# Patient Record
Sex: Male | Born: 1940 | Race: Black or African American | Hispanic: No | Marital: Married | Smoking: Former smoker
Health system: Southern US, Community
[De-identification: ages and names within clinical notes are randomized; demographics above are authoritative.]

## PROBLEM LIST (undated history)

## (undated) DIAGNOSIS — I10 Essential (primary) hypertension: Secondary | ICD-10-CM

## (undated) DIAGNOSIS — E119 Type 2 diabetes mellitus without complications: Secondary | ICD-10-CM

---

## 2017-05-04 ENCOUNTER — Emergency Department (HOSPITAL_COMMUNITY): Payer: Medicare Other

## 2017-05-04 ENCOUNTER — Emergency Department (HOSPITAL_COMMUNITY)
Admission: EM | Admit: 2017-05-04 | Discharge: 2017-05-05 | Disposition: A | Discharge status: Home / Self Care | Payer: Medicare Other | Attending: Emergency Medicine | Admitting: Emergency Medicine

## 2017-05-04 ENCOUNTER — Encounter (HOSPITAL_COMMUNITY): Payer: Self-pay | Admitting: Emergency Medicine

## 2017-05-04 DIAGNOSIS — E119 Type 2 diabetes mellitus without complications: Secondary | ICD-10-CM | POA: Diagnosis not present

## 2017-05-04 DIAGNOSIS — Z7901 Long term (current) use of anticoagulants: Secondary | ICD-10-CM | POA: Insufficient documentation

## 2017-05-04 DIAGNOSIS — Z79899 Other long term (current) drug therapy: Secondary | ICD-10-CM | POA: Diagnosis not present

## 2017-05-04 DIAGNOSIS — Z87891 Personal history of nicotine dependence: Secondary | ICD-10-CM | POA: Insufficient documentation

## 2017-05-04 DIAGNOSIS — M546 Pain in thoracic spine: Secondary | ICD-10-CM | POA: Diagnosis not present

## 2017-05-04 DIAGNOSIS — I10 Essential (primary) hypertension: Secondary | ICD-10-CM | POA: Insufficient documentation

## 2017-05-04 DIAGNOSIS — Y92410 Unspecified street and highway as the place of occurrence of the external cause: Secondary | ICD-10-CM | POA: Insufficient documentation

## 2017-05-04 DIAGNOSIS — R0602 Shortness of breath: Secondary | ICD-10-CM | POA: Insufficient documentation

## 2017-05-04 DIAGNOSIS — M542 Cervicalgia: Secondary | ICD-10-CM | POA: Insufficient documentation

## 2017-05-04 DIAGNOSIS — Y999 Unspecified external cause status: Secondary | ICD-10-CM | POA: Diagnosis not present

## 2017-05-04 DIAGNOSIS — Y939 Activity, unspecified: Secondary | ICD-10-CM | POA: Diagnosis not present

## 2017-05-04 HISTORY — DX: Type 2 diabetes mellitus without complications: E11.9

## 2017-05-04 HISTORY — DX: Essential (primary) hypertension: I10

## 2017-05-04 LAB — CBC
HCT: 37.4 % — ABNORMAL LOW (ref 39.0–52.0)
HEMOGLOBIN: 12.5 g/dL — AB (ref 13.0–17.0)
MCH: 30.9 pg (ref 26.0–34.0)
MCHC: 33.4 g/dL (ref 30.0–36.0)
MCV: 92.3 fL (ref 78.0–100.0)
Platelets: 178 10*3/uL (ref 150–400)
RBC: 4.05 MIL/uL — ABNORMAL LOW (ref 4.22–5.81)
RDW: 14.5 % (ref 11.5–15.5)
WBC: 7.2 10*3/uL (ref 4.0–10.5)

## 2017-05-04 LAB — COMPREHENSIVE METABOLIC PANEL
ALT: 13 U/L — AB (ref 17–63)
AST: 18 U/L (ref 15–41)
Albumin: 4.1 g/dL (ref 3.5–5.0)
Alkaline Phosphatase: 41 U/L (ref 38–126)
Anion gap: 6 (ref 5–15)
BILIRUBIN TOTAL: 0.9 mg/dL (ref 0.3–1.2)
BUN: 14 mg/dL (ref 6–20)
CO2: 22 mmol/L (ref 22–32)
CREATININE: 1.24 mg/dL (ref 0.61–1.24)
Calcium: 9.1 mg/dL (ref 8.9–10.3)
Chloride: 107 mmol/L (ref 101–111)
GFR calc Af Amer: >60 mL/min (ref >60)
GFR, EST NON AFRICAN AMERICAN: 55 mL/min — AB (ref >60)
Glucose, Bld: 83 mg/dL (ref 65–99)
Potassium: 3.8 mmol/L (ref 3.5–5.1)
Sodium: 135 mmol/L (ref 135–145)
TOTAL PROTEIN: 7.4 g/dL (ref 6.5–8.1)

## 2017-05-04 LAB — I-STAT TROPONIN, ED
TROPONIN I, POC: 0.04 ng/mL (ref 0.00–0.08)
Troponin i, poc: 0.04 ng/mL (ref 0.00–0.08)

## 2017-05-04 LAB — D-DIMER, QUANTITATIVE: D-Dimer, Quant: 0.28 ug/mL-FEU (ref 0.00–0.50)

## 2017-05-04 LAB — PROTIME-INR
INR: 1.87
Prothrombin Time: 21.8 seconds — ABNORMAL HIGH (ref 11.4–15.2)

## 2017-05-04 LAB — TROPONIN I: TROPONIN I: 0.03 ng/mL — AB (ref <0.03)

## 2017-05-04 MED ORDER — FENTANYL CITRATE (PF) 100 MCG/2ML IJ SOLN
50.0000 ug | Freq: Once | INTRAMUSCULAR | Status: AC
  Administered 2017-05-04: 50 ug via INTRAVENOUS
  Filled 2017-05-04: qty 2

## 2017-05-04 MED ORDER — CYCLOBENZAPRINE HCL 10 MG PO TABS
5.0000 mg | ORAL_TABLET | Freq: Once | ORAL | Status: AC
  Administered 2017-05-04: 5 mg via ORAL
  Filled 2017-05-04: qty 1
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
## 2017-05-04 NOTE — ED Notes (Signed)
ED Provider at bedside. 
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
## 2017-05-04 NOTE — ED Notes (Signed)
Date and time results received: 05/04/17 9:34 PM  (use smartphrase ".now" to insert current time)  Test: Troponin Critical Value: 0.03  Name of Provider Notified: Belfi MD  Orders Received? Or Actions Taken?: no new orders

## 2017-05-05 MED ORDER — HYDROCODONE-ACETAMINOPHEN 5-325 MG PO TABS: 1.0000 | ORAL_TABLET | Freq: Four times a day (QID) | ORAL | 0 refills | Status: AC | PRN

## 2017-05-05 MED ORDER — CYCLOBENZAPRINE HCL 10 MG PO TABS: 5.0000 mg | ORAL_TABLET | Freq: Three times a day (TID) | ORAL | 0 refills | Status: AC | PRN

## 2017-05-05 NOTE — ED Notes (Signed)
C-collar removed with okay from Dr. Elesa MassedWard.  Bag lunch provided at this time also with okay from physician.

## 2017-05-05 NOTE — Discharge Instructions (Addendum)
Your imaging showed no acute abnormality. It did show chronic compression fractures of your thoracic 9, 11 and 12 vertebra. I recommend close follow-up with your primary care physician.
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
